# Patient Record
Sex: Female | Born: 1975 | Race: White | Hispanic: No | Smoking: Former smoker
Health system: Southern US, Community
[De-identification: ages and names within clinical notes are randomized; demographics above are authoritative.]

## PROBLEM LIST (undated history)

## (undated) DIAGNOSIS — K219 Gastro-esophageal reflux disease without esophagitis: Secondary | ICD-10-CM

## (undated) DIAGNOSIS — F329 Major depressive disorder, single episode, unspecified: Secondary | ICD-10-CM

## (undated) DIAGNOSIS — F32A Depression, unspecified: Secondary | ICD-10-CM

---

## 2000-08-19 ENCOUNTER — Other Ambulatory Visit: Admission: RE | Admit: 2000-08-19 | Discharge: 2000-08-19 | Payer: Self-pay | Admitting: Obstetrics and Gynecology

## 2001-08-24 ENCOUNTER — Other Ambulatory Visit: Admission: RE | Admit: 2001-08-24 | Discharge: 2001-08-24 | Payer: Self-pay | Admitting: Obstetrics and Gynecology

## 2002-08-28 ENCOUNTER — Other Ambulatory Visit: Admission: RE | Admit: 2002-08-28 | Discharge: 2002-08-28 | Payer: Self-pay | Admitting: Obstetrics and Gynecology

## 2002-10-12 ENCOUNTER — Ambulatory Visit (HOSPITAL_COMMUNITY): Admission: RE | Admit: 2002-10-12 | Discharge: 2002-10-12 | Payer: Self-pay | Admitting: Obstetrics and Gynecology

## 2002-10-12 ENCOUNTER — Encounter: Payer: Self-pay | Admitting: Obstetrics and Gynecology

## 2003-02-06 ENCOUNTER — Inpatient Hospital Stay (HOSPITAL_COMMUNITY): Admission: AD | Admit: 2003-02-06 | Discharge: 2003-02-06 | Payer: Self-pay | Admitting: Obstetrics and Gynecology

## 2003-03-06 ENCOUNTER — Inpatient Hospital Stay (HOSPITAL_COMMUNITY): Admission: AD | Admit: 2003-03-06 | Discharge: 2003-03-08 | Payer: Self-pay | Admitting: Obstetrics and Gynecology

## 2004-08-29 ENCOUNTER — Other Ambulatory Visit: Admission: RE | Admit: 2004-08-29 | Discharge: 2004-08-29 | Payer: Self-pay | Admitting: Obstetrics and Gynecology

## 2005-10-21 ENCOUNTER — Other Ambulatory Visit: Admission: RE | Admit: 2005-10-21 | Discharge: 2005-10-21 | Payer: Self-pay | Admitting: Obstetrics and Gynecology

## 2017-01-20 ENCOUNTER — Encounter (HOSPITAL_BASED_OUTPATIENT_CLINIC_OR_DEPARTMENT_OTHER): Payer: Self-pay

## 2017-01-20 ENCOUNTER — Emergency Department (HOSPITAL_BASED_OUTPATIENT_CLINIC_OR_DEPARTMENT_OTHER): Payer: BLUE CROSS/BLUE SHIELD

## 2017-01-20 ENCOUNTER — Observation Stay (HOSPITAL_BASED_OUTPATIENT_CLINIC_OR_DEPARTMENT_OTHER)
Admission: EM | Admit: 2017-01-20 | Discharge: 2017-01-21 | Disposition: A | Discharge status: Home / Self Care | Payer: BLUE CROSS/BLUE SHIELD | Attending: Surgery | Admitting: Surgery

## 2017-01-20 DIAGNOSIS — Z87891 Personal history of nicotine dependence: Secondary | ICD-10-CM | POA: Diagnosis not present

## 2017-01-20 DIAGNOSIS — K449 Diaphragmatic hernia without obstruction or gangrene: Secondary | ICD-10-CM | POA: Diagnosis not present

## 2017-01-20 DIAGNOSIS — F329 Major depressive disorder, single episode, unspecified: Secondary | ICD-10-CM | POA: Insufficient documentation

## 2017-01-20 DIAGNOSIS — K429 Umbilical hernia without obstruction or gangrene: Secondary | ICD-10-CM | POA: Diagnosis not present

## 2017-01-20 DIAGNOSIS — K219 Gastro-esophageal reflux disease without esophagitis: Secondary | ICD-10-CM | POA: Diagnosis not present

## 2017-01-20 DIAGNOSIS — F32A Depression, unspecified: Secondary | ICD-10-CM | POA: Diagnosis present

## 2017-01-20 DIAGNOSIS — K3580 Unspecified acute appendicitis: Secondary | ICD-10-CM

## 2017-01-20 DIAGNOSIS — R1031 Right lower quadrant pain: Secondary | ICD-10-CM | POA: Diagnosis present

## 2017-01-20 DIAGNOSIS — Z23 Encounter for immunization: Secondary | ICD-10-CM | POA: Diagnosis not present

## 2017-01-20 DIAGNOSIS — K353 Acute appendicitis with localized peritonitis, without perforation or gangrene: Secondary | ICD-10-CM

## 2017-01-20 DIAGNOSIS — K37 Unspecified appendicitis: Secondary | ICD-10-CM | POA: Diagnosis present

## 2017-01-20 HISTORY — DX: Gastro-esophageal reflux disease without esophagitis: K21.9

## 2017-01-20 HISTORY — DX: Major depressive disorder, single episode, unspecified: F32.9

## 2017-01-20 HISTORY — DX: Depression, unspecified: F32.A

## 2017-01-20 LAB — URINALYSIS, ROUTINE W REFLEX MICROSCOPIC
Glucose, UA: NEGATIVE mg/dL
Ketones, ur: >80 mg/dL — AB
Leukocytes, UA: NEGATIVE
Nitrite: NEGATIVE
Protein, ur: NEGATIVE mg/dL
Specific Gravity, Urine: 1.025 (ref 1.005–1.030)
pH: 6 (ref 5.0–8.0)

## 2017-01-20 LAB — CBC WITH DIFFERENTIAL/PLATELET
Basophils Absolute: 0 10*3/uL (ref 0.0–0.1)
Basophils Relative: 0 %
Eosinophils Absolute: 0.1 10*3/uL (ref 0.0–0.7)
Eosinophils Relative: 1 %
HEMATOCRIT: 35.7 % — AB (ref 36.0–46.0)
Hemoglobin: 12.6 g/dL (ref 12.0–15.0)
LYMPHS ABS: 2 10*3/uL (ref 0.7–4.0)
LYMPHS PCT: 14 %
MCH: 33.7 pg (ref 26.0–34.0)
MCHC: 35.3 g/dL (ref 30.0–36.0)
MCV: 95.5 fL (ref 78.0–100.0)
MONO ABS: 1 10*3/uL (ref 0.1–1.0)
MONOS PCT: 7 %
NEUTROS ABS: 11.3 10*3/uL — AB (ref 1.7–7.7)
Neutrophils Relative %: 78 %
Platelets: 246 10*3/uL (ref 150–400)
RBC: 3.74 MIL/uL — ABNORMAL LOW (ref 3.87–5.11)
RDW: 11.3 % — AB (ref 11.5–15.5)
WBC: 14.3 10*3/uL — ABNORMAL HIGH (ref 4.0–10.5)

## 2017-01-20 LAB — BASIC METABOLIC PANEL
ANION GAP: 9 (ref 5–15)
BUN: 8 mg/dL (ref 6–20)
CO2: 25 mmol/L (ref 22–32)
Calcium: 8.6 mg/dL — ABNORMAL LOW (ref 8.9–10.3)
Chloride: 101 mmol/L (ref 101–111)
Creatinine, Ser: 0.65 mg/dL (ref 0.44–1.00)
GFR calc Af Amer: >60 mL/min (ref >60)
GFR calc non Af Amer: >60 mL/min (ref >60)
GLUCOSE: 93 mg/dL (ref 65–99)
Potassium: 3.3 mmol/L — ABNORMAL LOW (ref 3.5–5.1)
Sodium: 135 mmol/L (ref 135–145)

## 2017-01-20 LAB — URINALYSIS, MICROSCOPIC (REFLEX): WBC, UA: NONE SEEN WBC/hpf (ref 0–5)

## 2017-01-20 LAB — PREGNANCY, URINE: Preg Test, Ur: NEGATIVE

## 2017-01-20 MED ORDER — SODIUM CHLORIDE 0.9 % IV BOLUS (SEPSIS)
1000.0000 mL | Freq: Once | INTRAVENOUS | Status: AC
  Administered 2017-01-20: 1000 mL via INTRAVENOUS

## 2017-01-20 MED ORDER — ONDANSETRON HCL 4 MG/2ML IJ SOLN
4.0000 mg | Freq: Once | INTRAMUSCULAR | Status: AC
  Administered 2017-01-20: 4 mg via INTRAVENOUS
  Filled 2017-01-20: qty 2

## 2017-01-20 MED ORDER — IOPAMIDOL (ISOVUE-300) INJECTION 61%
100.0000 mL | Freq: Once | INTRAVENOUS | Status: AC | PRN
  Administered 2017-01-20: 100 mL via INTRAVENOUS

## 2017-01-20 MED ORDER — PIPERACILLIN-TAZOBACTAM 3.375 G IVPB 30 MIN
3.3750 g | Freq: Once | INTRAVENOUS | Status: AC
  Administered 2017-01-20: 3.375 g via INTRAVENOUS
  Filled 2017-01-20 (×2): qty 50

## 2017-01-20 MED ORDER — LACTATED RINGERS IV BOLUS (SEPSIS)
1000.0000 mL | Freq: Once | INTRAVENOUS | Status: AC
  Administered 2017-01-20: 1000 mL via INTRAVENOUS

## 2017-01-20 NOTE — H&P (Signed)
[facility]  [address] [place], [place] [address] Phone: [phone] FAX: [phone]     Veronica Roberts  [birth year] [id number]  CARE TEAM:  PCP: No primary care provider on file.  Outpatient Care Team: No care team member to display  Inpatient Treatment Team: Treatment Team: Attending Provider: Davonna Belling, MD; Registered Nurse: Beckey Downing, RN; Consulting Physician: Nolon Nations, MD   This patient is a 41 y.o.female who presents today for surgical evaluation at the request of Dr Alvino Chapel.   Reason for evaluation: Appendcitis  Pleasant active woman with no prior surgeries or abdominal problems.  Noticed vague abdominal pain and decreased appetite during yesterday.  Became more intense.  Felt nauseated but did not vomit.  Normally moves her bowels every day but bowels became a little bit more loose.  No hematochezia or melena.  Pain became much more intense in the right lower quadrant.  Was concerned.  CAme to the [place] room.  History and physical suspicious for appendicitis.  Much more suspicious suspicious for that.  Surgical consultation requested.  Patient transferred to [facility] be able to evaluated by general surgery.  No personal nor family history of GI/colon cancer, inflammatory bowel disease, irritable bowel syndrome, allergy such as Celiac Sprue, dietary/dairy problems, colitis, ulcers nor gastritis.  No recent sick contacts/gastroenteritis.  No travel outside the country.  No changes in diet.  No dysphagia to solids or liquids.  No significant heartburn or reflux.  No hematochezia, hematemesis, coffee ground emesis.  No evidence of prior gastric/peptic ulceration.    Assessment  Veronica Roberts  41 y.o. female       Problem List:  Active Problems:   Acute appendicitis   Acute appendicitis  Plan:  -Admit -IVF -Nausea and pain control. -IV antibiotics.  We will do Zosyn given IV  metronidazole shortage -Diagnostic laparoscopy with appendectomy:  The anatomy & physiology of the digestive tract was discussed.  The pathophysiology of appendicitis and other appendiceal disorders were discussed.  Natural history risks without surgery was discussed.   I feel the risks of no intervention will lead to serious problems that outweigh the operative risks; therefore, I recommended diagnostic laparoscopy with removal of appendix to remove the pathology.  Laparoscopic & open techniques were discussed.   I noted a good likelihood this will help address the problem.   Risks such as bleeding, infection, abscess, leak, reoperation, injury to other organs, need for repair of tissues / organs, possible ostomy, hernia, heart attack, stroke, death, and other risks were discussed.  Goals of post-operative recovery were discussed as well.  We will work to minimize complications.  Questions were answered.  The patient & husband express understanding & wishes to proceed with surgery.  -VTE prophylaxis- SCDs, etc -mobilize as tolerated to help recovery    Adin Hector, M.D., F.A.C.S. Gastrointestinal and Minimally Invasive Surgery [facility] [address], [place], [place] [address] [phone] Main / Paging   01/20/2017      Past Medical History:  Diagnosis Date  . Depression   . GERD (gastroesophageal reflux disease)     History reviewed. No pertinent surgical history.  Social History   Social History  . Marital status: Married    Spouse name: N/A  . Number of children: N/A  . Years of education: N/A   Occupational History  . Not on file.   Social History Main Topics  .  Smoking status: Former Research scientist (life sciences)  . Smokeless tobacco: Never Used  . Alcohol use Yes     Comment: weekly  . Drug use: No  . Sexual activity: Not on file   Other Topics Concern  . Not on file   Social History Narrative  . No narrative on file    No family  history on file.  Current Facility-Administered Medications  Medication Dose Route Frequency Provider Last Rate Last Dose  . lactated ringers bolus 1,000 mL  1,000 mL Intravenous Once Michael Boston, MD      . piperacillin-tazobactam (ZOSYN) IVPB 3.375 g  3.375 g Intravenous Once Davonna Belling, MD       Current Outpatient Prescriptions  Medication Sig Dispense Refill  . Escitalopram Oxalate (LEXAPRO PO) Take by mouth.    . Omeprazole (PRILOSEC PO) Take by mouth.       No Known Allergies  ROS: Constitutional:  No fevers, chills, sweats.  Weight stable Eyes:  No vision changes, No discharge HENT:  No sore throats, nasal drainage Lymph: No neck swelling, No bruising easily Pulmonary:  No cough, productive sputum CV: No orthopnea, PND  Patient walks 90 minutes for about 5 miles without difficulty.  No exertional chest/neck/shoulder/arm pain. GI: No personal nor family history of GI/colon cancer, inflammatory bowel disease, irritable bowel syndrome, allergy such as Celiac Sprue, dietary/dairy problems, colitis, ulcers nor gastritis.  No recent sick contacts/gastroenteritis.  No travel outside the country.  No changes in diet. Renal: No UTIs, No hematuria Genital:  No drainage, bleeding, masses Musculoskeletal: No severe joint pain.  Good ROM major joints Skin:  No sores or lesions.  No rashes Heme/Lymph:  No easy bleeding.  No swollen lymph nodes Neuro: No focal weakness/numbness.  No seizures Psych: No suicidal ideation.  No hallucinations  BP 108/75 (BP Location: Right Arm)   Pulse 71   Temp 99.1 F (37.3 C) (Oral)   Resp 16   Ht 5' 9"  (1.753 m)   Wt 90.3 kg (199 lb)   SpO2 100%   BMI 29.39 kg/m   Physical Exam: General: Pt awake/alert/oriented x4 in mild major acute distress Eyes: PERRL, normal EOM. Sclera nonicteric Neuro: CN II-XII intact w/o focal sensory/motor deficits. Lymph: No head/neck/groin lymphadenopathy Psych:  No delerium/psychosis/paranoia HENT:  Normocephalic, Mucus membranes moist.  No thrush Neck: Supple, No tracheal deviation Chest: No pain.  Good respiratory excursion. CV:  Pulses intact.  Regular rhythm  Abdomen: Soft, Nondistended.  TTP RLQ/suprapubic with guarding.  No diastasis recti.  No umbilical hernia.    Gen:  No inguinal hernias.  No inguinal lymphadenopathy.   Ext:  SCDs BLE.  No significant edema.  No cyanosis Skin: No petechiae / purpurea.  No major sores Musculoskeletal: No severe joint pain.  Good ROM major joints   Results:   Labs: Results for orders placed or performed during the hospital encounter of 01/20/17 (from the past 48 hour(s))  Pregnancy, urine     Status: None   Collection Time: 01/20/17  7:20 PM  Result Value Ref Range   Preg Test, Ur NEGATIVE NEGATIVE    Comment:        THE SENSITIVITY OF THIS METHODOLOGY IS >20 mIU/mL.   Urinalysis, Routine w reflex microscopic     Status: Abnormal   Collection Time: 01/20/17  7:20 PM  Result Value Ref Range   Color, Urine ORANGE (A) YELLOW    Comment: BIOCHEMICALS MAY BE AFFECTED BY COLOR   APPearance CLEAR CLEAR   Specific Gravity,  Urine 1.025 1.005 - 1.030   pH 6.0 5.0 - 8.0   Glucose, UA NEGATIVE NEGATIVE mg/dL   Hgb urine dipstick TRACE (A) NEGATIVE   Bilirubin Urine MODERATE (A) NEGATIVE   Ketones, ur >80 (A) NEGATIVE mg/dL   Protein, ur NEGATIVE NEGATIVE mg/dL   Nitrite NEGATIVE NEGATIVE   Leukocytes, UA NEGATIVE NEGATIVE  Urinalysis, Microscopic (reflex)     Status: Abnormal   Collection Time: 01/20/17  7:20 PM  Result Value Ref Range   RBC / HPF 0-5 0 - 5 RBC/hpf   WBC, UA NONE SEEN 0 - 5 WBC/hpf   Bacteria, UA RARE (A) NONE SEEN   Squamous Epithelial / LPF 0-5 (A) NONE SEEN  CBC with Differential     Status: Abnormal   Collection Time: 01/20/17  8:30 PM  Result Value Ref Range   WBC 14.3 (H) 4.0 - 10.5 K/uL   RBC 3.74 (L) 3.87 - 5.11 MIL/uL   Hemoglobin 12.6 12.0 - 15.0 g/dL   HCT 35.7 (L) 36.0 - 46.0 %   MCV 95.5 78.0 - 100.0  fL   MCH 33.7 26.0 - 34.0 pg   MCHC 35.3 30.0 - 36.0 g/dL   RDW 11.3 (L) 11.5 - 15.5 %   Platelets 246 150 - 400 K/uL   Neutrophils Relative % 78 %   Neutro Abs 11.3 (H) 1.7 - 7.7 K/uL   Lymphocytes Relative 14 %   Lymphs Abs 2.0 0.7 - 4.0 K/uL   Monocytes Relative 7 %   Monocytes Absolute 1.0 0.1 - 1.0 K/uL   Eosinophils Relative 1 %   Eosinophils Absolute 0.1 0.0 - 0.7 K/uL   Basophils Relative 0 %   Basophils Absolute 0.0 0.0 - 0.1 K/uL  Basic metabolic panel     Status: Abnormal   Collection Time: 01/20/17  8:30 PM  Result Value Ref Range   Sodium 135 135 - 145 mmol/L   Potassium 3.3 (L) 3.5 - 5.1 mmol/L   Chloride 101 101 - 111 mmol/L   CO2 25 22 - 32 mmol/L   Glucose, Bld 93 65 - 99 mg/dL   BUN 8 6 - 20 mg/dL   Creatinine, Ser 0.65 0.44 - 1.00 mg/dL   Calcium 8.6 (L) 8.9 - 10.3 mg/dL   GFR calc non Af Amer >60 >60 mL/min   GFR calc Af Amer >60 >60 mL/min    Comment: (NOTE) The eGFR has been calculated using the CKD EPI equation. This calculation has not been validated in all clinical situations. eGFR's persistently <60 mL/min signify possible Chronic Kidney Disease.    Anion gap 9 5 - 15    Imaging / Studies: Ct Abdomen Pelvis W Contrast  Result Date: 01/20/2017 CLINICAL DATA:  Abdominal pain for 3 days, nausea. EXAM: CT ABDOMEN AND PELVIS WITH CONTRAST TECHNIQUE: Multidetector CT imaging of the abdomen and pelvis was performed using the standard protocol following bolus administration of intravenous contrast. CONTRAST:  163m ISOVUE-300 IOPAMIDOL (ISOVUE-300) INJECTION 61% COMPARISON:  None. FINDINGS: LOWER CHEST: Lung bases are clear. Included heart size is normal. No pericardial effusion. HEPATOBILIARY: A few scattered subcentimeter hypodensities in the liver most compatible with cysts, otherwise unremarkable. Gallbladder is normal. PANCREAS: Normal. SPLEEN: Normal. ADRENALS/URINARY TRACT: Kidneys are orthotopic, demonstrating symmetric enhancement. No  nephrolithiasis, hydronephrosis or solid renal masses. The unopacified ureters are normal in course and caliber. Delayed imaging through the kidneys demonstrates symmetric prompt contrast excretion within the proximal urinary collecting system. Urinary bladder is partially distended and unremarkable. Normal  adrenal glands. STOMACH/BOWEL: Small hiatal hernia. The stomach, small and large bowel are normal in course and caliber without inflammatory changes. The appendix is 16 mm in transaxial dimension, hyperemic with periappendiceal inflammation. Central appendiceal debris without appendicolith. VASCULAR/LYMPHATIC: Aortoiliac vessels are normal in course and caliber, trace calcific atherosclerosis. Borderline lymph nodes RIGHT lower quadrant are likely reactive. REPRODUCTIVE: IUD in central uterus. OTHER: Trace free fluid in RIGHT lower quadrant. Small amount of free fluid in the pelvis which may be physiologic or reactive to appendiceal process. MUSCULOSKELETAL: Nonacute. Small fat containing umbilical hernia. Scattered Schmorl's nodes. IMPRESSION: Acute non perforated appendicitis. Acute findings discussed with and reconfirmed by Dr.NATHAN PICKERING on 01/20/2017 at 10:06 pm. Electronically Signed   By: Elon Alas M.D.   On: 01/20/2017 22:07    Medications / Allergies: per chart  Antibiotics: Anti-infectives    Start     Dose/Rate Route Frequency Ordered Stop   01/20/17 2230  piperacillin-tazobactam (ZOSYN) IVPB 3.375 g     3.375 g 100 mL/hr over 30 Minutes Intravenous  Once 01/20/17 2217          Note: Portions of this report may have been transcribed using voice recognition software. Every effort was made to ensure accuracy; however, inadvertent computerized transcription errors may be present.   Any transcriptional errors that result from this process are unintentional.    Adin Hector, M.D., F.A.C.S. Gastrointestinal and Minimally Invasive Surgery [facility] [address] [place], [place] [address] [phone] Main / Paging   01/20/2017

## 2017-01-20 NOTE — ED Triage Notes (Signed)
C/o abd pain x 3 days-nausea-no v/d-NAD-steady gait

## 2017-01-20 NOTE — ED Provider Notes (Signed)
[facility] DEPT [facility] Provider Note   CSN: [id number] Arrival date & time: 01/20/17  1837  By signing my name below, I, Jaquelyn Bitter., attest that this documentation has been prepared under the direction and in the presence of Davonna Belling, MD. Electronically signed: Jaquelyn Bitter., ED Scribe. 01/20/17. 10:55 PM.   History   Chief Complaint Chief Complaint  Patient presents with  . Abdominal Pain    HPI  Veronica Roberts is a 41 y.o. female who presents to the Emergency Department complaining of constantly worsening, mild to moderate abdominal pain with sudden onset x3 days. Pt states that she awoke Monday night with sharp epigastric abdominal pain. She states that the pain has been constant since Monday but today, the pain is localized in RLQ. She reports the pain being exacerbated with sudden movements. Pt reports nausea, chills, appetite change. She denies taking anything for the pain. Pt denies fever, dysuria, vaginal bleeding/discharge, frequency, urgency. Of note, pt has had IUD for x13 years.    The history is provided by the patient. No language interpreter was used.    Past Medical History:  Diagnosis Date  . Depression   . GERD (gastroesophageal reflux disease)     Patient Active Problem List   Diagnosis Date Noted  . Acute appendicitis 01/20/2017  . GERD (gastroesophageal reflux disease)   . Depression     History reviewed. No pertinent surgical history.  OB History    No data available       Home Medications    Prior to Admission medications   Medication Sig Start Date End Date Taking? Authorizing Provider  Escitalopram Oxalate (LEXAPRO PO) Take by mouth.   Yes Historical Provider, MD  Omeprazole (PRILOSEC PO) Take by mouth.   Yes Historical Provider, MD    Family History No family history on file.  Social History Social History  Substance Use Topics  . Smoking status: Former Research scientist (life sciences)  . Smokeless tobacco: Never Used    . Alcohol use Yes     Comment: weekly     Allergies   Patient has no known allergies.   Review of Systems Review of Systems  Constitutional: Positive for appetite change and chills. Negative for fever.  HENT: Negative for congestion.   Gastrointestinal: Positive for abdominal pain (RLQ) and nausea. Negative for vomiting.  Genitourinary: Negative for dysuria, frequency, urgency, vaginal bleeding and vaginal discharge.  All other systems reviewed and are negative.    Physical Exam Updated Vital Signs BP 108/75 (BP Location: Right Arm)   Pulse 71   Temp 99.1 F (37.3 C) (Oral)   Resp 16   Ht 5' 9"  (1.753 m)   Wt 199 lb (90.3 kg)   SpO2 100%   BMI 29.39 kg/m   Physical Exam  Constitutional: She appears well-developed and well-nourished. No distress.  HENT:  Head: Normocephalic and atraumatic.  Eyes: Conjunctivae are normal.  Neck: Neck supple.  Cardiovascular: Normal rate and regular rhythm.   No murmur heard. Pulmonary/Chest: Effort normal and breath sounds normal. No respiratory distress.  Abdominal: Soft. There is tenderness in the right lower quadrant. There is guarding.  Moderate RLQ tenderness, no masses. Pt exhibits involuntary guarding.   Musculoskeletal: She exhibits no edema.  Neurological: She is alert.  Skin: Skin is warm and dry.  Psychiatric: She has a normal mood and affect.  Nursing note and vitals reviewed.    ED Treatments / Results   DIAGNOSTIC STUDIES: Oxygen Saturation is 100% on  RA, normal by my interpretation.   COORDINATION OF CARE: 10:55 PM-Discussed next steps with pt. Pt verbalized understanding and is agreeable with the plan.    Labs (all labs ordered are listed, but only abnormal results are displayed) Labs Reviewed  URINALYSIS, ROUTINE W REFLEX MICROSCOPIC - Abnormal; Notable for the following:       Result Value   Color, Urine ORANGE (*)    Hgb urine dipstick TRACE (*)    Bilirubin Urine MODERATE (*)    Ketones, ur >80  (*)    All other components within normal limits  URINALYSIS, MICROSCOPIC (REFLEX) - Abnormal; Notable for the following:    Bacteria, UA RARE (*)    Squamous Epithelial / LPF 0-5 (*)    All other components within normal limits  CBC WITH DIFFERENTIAL/PLATELET - Abnormal; Notable for the following:    WBC 14.3 (*)    RBC 3.74 (*)    HCT 35.7 (*)    RDW 11.3 (*)    Neutro Abs 11.3 (*)    All other components within normal limits  BASIC METABOLIC PANEL - Abnormal; Notable for the following:    Potassium 3.3 (*)    Calcium 8.6 (*)    All other components within normal limits  PREGNANCY, URINE    EKG  EKG Interpretation None       Radiology Ct Abdomen Pelvis W Contrast  Result Date: 01/20/2017 CLINICAL DATA:  Abdominal pain for 3 days, nausea. EXAM: CT ABDOMEN AND PELVIS WITH CONTRAST TECHNIQUE: Multidetector CT imaging of the abdomen and pelvis was performed using the standard protocol following bolus administration of intravenous contrast. CONTRAST:  152m ISOVUE-300 IOPAMIDOL (ISOVUE-300) INJECTION 61% COMPARISON:  None. FINDINGS: LOWER CHEST: Lung bases are clear. Included heart size is normal. No pericardial effusion. HEPATOBILIARY: A few scattered subcentimeter hypodensities in the liver most compatible with cysts, otherwise unremarkable. Gallbladder is normal. PANCREAS: Normal. SPLEEN: Normal. ADRENALS/URINARY TRACT: Kidneys are orthotopic, demonstrating symmetric enhancement. No nephrolithiasis, hydronephrosis or solid renal masses. The unopacified ureters are normal in course and caliber. Delayed imaging through the kidneys demonstrates symmetric prompt contrast excretion within the proximal urinary collecting system. Urinary bladder is partially distended and unremarkable. Normal adrenal glands. STOMACH/BOWEL: Small hiatal hernia. The stomach, small and large bowel are normal in course and caliber without inflammatory changes. The appendix is 16 mm in transaxial dimension,  hyperemic with periappendiceal inflammation. Central appendiceal debris without appendicolith. VASCULAR/LYMPHATIC: Aortoiliac vessels are normal in course and caliber, trace calcific atherosclerosis. Borderline lymph nodes RIGHT lower quadrant are likely reactive. REPRODUCTIVE: IUD in central uterus. OTHER: Trace free fluid in RIGHT lower quadrant. Small amount of free fluid in the pelvis which may be physiologic or reactive to appendiceal process. MUSCULOSKELETAL: Nonacute. Small fat containing umbilical hernia. Scattered Schmorl's nodes. IMPRESSION: Acute non perforated appendicitis. Acute findings discussed with and reconfirmed by Dr.Walter Min on 01/20/2017 at 10:06 pm. Electronically Signed   By: CElon AlasM.D.   On: 01/20/2017 22:07    Procedures Procedures (including critical care time)  Medications Ordered in ED Medications  piperacillin-tazobactam (ZOSYN) IVPB 3.375 g (3.375 g Intravenous New Bag/Given 01/20/17 2227)  lactated ringers bolus 1,000 mL (not administered)  sodium chloride 0.9 % bolus 1,000 mL (0 mLs Intravenous Stopped 01/20/17 2131)  ondansetron (ZOFRAN) injection 4 mg (4 mg Intravenous Given 01/20/17 2040)  iopamidol (ISOVUE-300) 61 % injection 100 mL (100 mLs Intravenous Contrast Given 01/20/17 2145)     Initial Impression / Assessment and Plan / ED Course  I have reviewed the triage vital signs and the nursing notes.  Pertinent labs & imaging results that were available during my care of the patient were reviewed by me and considered in my medical decision making (see chart for details).     Patient with Abdominal pain. Found to have appendicitis. White count is elevated. Discussed with Dr. gross, who will not accept the patient to him but he will see her there. Patient prefers to go by private vehicle. Will give fluid bolus and antibiotics here. Will keep patient nothing by mouth. I also discussed with Dr. Ellender Hose at [place].  Final Clinical  Impressions(s) / ED Diagnoses   Final diagnoses:  Acute appendicitis with localized peritonitis    New Prescriptions New Prescriptions   No medications on file   I personally performed the services described in this documentation, which was scribed in my presence. The recorded information has been reviewed and is accurate.        Davonna Belling, MD 01/20/17 2255

## 2017-01-21 ENCOUNTER — Inpatient Hospital Stay (HOSPITAL_COMMUNITY): Payer: BLUE CROSS/BLUE SHIELD | Admitting: Registered Nurse

## 2017-01-21 ENCOUNTER — Encounter (HOSPITAL_COMMUNITY): Admission: EM | Disposition: A | Discharge status: Home / Self Care | Payer: Self-pay | Source: Home / Self Care

## 2017-01-21 ENCOUNTER — Encounter (HOSPITAL_COMMUNITY): Payer: Self-pay | Admitting: *Deleted

## 2017-01-21 DIAGNOSIS — K37 Unspecified appendicitis: Secondary | ICD-10-CM | POA: Diagnosis present

## 2017-01-21 HISTORY — PX: LAPAROSCOPIC APPENDECTOMY: SHX408

## 2017-01-21 LAB — SURGICAL PCR SCREEN
MRSA, PCR: INVALID — AB
Staphylococcus aureus: INVALID — AB

## 2017-01-21 SURGERY — APPENDECTOMY, LAPAROSCOPIC
Anesthesia: General | Site: Abdomen

## 2017-01-21 MED ORDER — OXYCODONE HCL 5 MG PO TABS: 5.0000 mg | ORAL_TABLET | ORAL | 0 refills | Status: AC | PRN

## 2017-01-21 MED ORDER — CELECOXIB 400 MG PO CAPS
400.0000 mg | ORAL_CAPSULE | ORAL | Status: AC
  Administered 2017-01-21: 400 mg via ORAL
  Filled 2017-01-21: qty 2
  Filled 2017-01-21: qty 1

## 2017-01-21 MED ORDER — PROPOFOL 10 MG/ML IV BOLUS
INTRAVENOUS | Status: AC
  Filled 2017-01-21: qty 20

## 2017-01-21 MED ORDER — LACTATED RINGERS IV SOLN
INTRAVENOUS | Status: DC | PRN
  Administered 2017-01-21: 10:00:00 via INTRAVENOUS

## 2017-01-21 MED ORDER — ONDANSETRON 4 MG PO TBDP: 4.0000 mg | ORAL_TABLET | Freq: Four times a day (QID) | ORAL | Status: DC | PRN

## 2017-01-21 MED ORDER — SODIUM CHLORIDE 0.9 % IJ SOLN
INTRAMUSCULAR | Status: AC
  Filled 2017-01-21: qty 10

## 2017-01-21 MED ORDER — 0.9 % SODIUM CHLORIDE (POUR BTL) OPTIME
TOPICAL | Status: DC | PRN
  Administered 2017-01-21: 1000 mL

## 2017-01-21 MED ORDER — PIPERACILLIN-TAZOBACTAM 3.375 G IVPB
3.3750 g | Freq: Three times a day (TID) | INTRAVENOUS | Status: DC
  Administered 2017-01-21 (×2): 3.375 g via INTRAVENOUS
  Filled 2017-01-21 (×3): qty 50

## 2017-01-21 MED ORDER — OXYCODONE HCL 5 MG PO TABS: 5.0000 mg | ORAL_TABLET | ORAL | Status: DC | PRN

## 2017-01-21 MED ORDER — LACTATED RINGERS IV SOLN: INTRAVENOUS | Status: DC

## 2017-01-21 MED ORDER — HYDROMORPHONE HCL 2 MG/ML IJ SOLN: 0.5000 mg | INTRAMUSCULAR | Status: DC | PRN

## 2017-01-21 MED ORDER — DEXTROSE 5 % IV SOLN
2.0000 g | INTRAVENOUS | Status: AC
  Administered 2017-01-21: 2 g via INTRAVENOUS
  Filled 2017-01-21: qty 2

## 2017-01-21 MED ORDER — METHOCARBAMOL 1000 MG/10ML IJ SOLN
1000.0000 mg | Freq: Four times a day (QID) | INTRAVENOUS | Status: DC | PRN
  Filled 2017-01-21: qty 10

## 2017-01-21 MED ORDER — METOCLOPRAMIDE HCL 5 MG/ML IJ SOLN: 5.0000 mg | Freq: Four times a day (QID) | INTRAMUSCULAR | Status: DC | PRN

## 2017-01-21 MED ORDER — ZOLPIDEM TARTRATE 5 MG PO TABS: 5.0000 mg | ORAL_TABLET | Freq: Every evening | ORAL | Status: DC | PRN

## 2017-01-21 MED ORDER — MENTHOL 3 MG MT LOZG: 1.0000 | LOZENGE | OROMUCOSAL | Status: DC | PRN

## 2017-01-21 MED ORDER — ENOXAPARIN SODIUM 40 MG/0.4ML ~~LOC~~ SOLN: 40.0000 mg | SUBCUTANEOUS | Status: DC

## 2017-01-21 MED ORDER — MIDAZOLAM HCL 5 MG/5ML IJ SOLN
INTRAMUSCULAR | Status: DC | PRN
  Administered 2017-01-21: 2 mg via INTRAVENOUS

## 2017-01-21 MED ORDER — LACTATED RINGERS IR SOLN
Status: DC | PRN
  Administered 2017-01-21: 1000 mL

## 2017-01-21 MED ORDER — ALUM & MAG HYDROXIDE-SIMETH 200-200-20 MG/5ML PO SUSP: 30.0000 mL | Freq: Four times a day (QID) | ORAL | Status: DC | PRN

## 2017-01-21 MED ORDER — ROCURONIUM BROMIDE 10 MG/ML (PF) SYRINGE
PREFILLED_SYRINGE | INTRAVENOUS | Status: DC | PRN
  Administered 2017-01-21: 40 mg via INTRAVENOUS

## 2017-01-21 MED ORDER — LIDOCAINE HCL (CARDIAC) 20 MG/ML IV SOLN
INTRAVENOUS | Status: DC | PRN
  Administered 2017-01-21: 100 mg via INTRAVENOUS

## 2017-01-21 MED ORDER — CHLORHEXIDINE GLUCONATE CLOTH 2 % EX PADS: 6.0000 | MEDICATED_PAD | Freq: Once | CUTANEOUS | Status: DC

## 2017-01-21 MED ORDER — LACTATED RINGERS IV BOLUS (SEPSIS)
1000.0000 mL | Freq: Once | INTRAVENOUS | Status: AC
  Administered 2017-01-21: 1000 mL via INTRAVENOUS

## 2017-01-21 MED ORDER — ROCURONIUM BROMIDE 50 MG/5ML IV SOSY
PREFILLED_SYRINGE | INTRAVENOUS | Status: AC
  Filled 2017-01-21: qty 5

## 2017-01-21 MED ORDER — LIP MEDEX EX OINT
1.0000 "application " | TOPICAL_OINTMENT | Freq: Two times a day (BID) | CUTANEOUS | Status: DC
  Administered 2017-01-21: 1 via TOPICAL
  Filled 2017-01-21: qty 7

## 2017-01-21 MED ORDER — SUGAMMADEX SODIUM 200 MG/2ML IV SOLN
INTRAVENOUS | Status: DC | PRN
  Administered 2017-01-21: 180 mg via INTRAVENOUS

## 2017-01-21 MED ORDER — ONDANSETRON HCL 4 MG/2ML IJ SOLN
4.0000 mg | Freq: Four times a day (QID) | INTRAMUSCULAR | Status: DC | PRN
Start: 2017-01-21 — End: 2017-01-21
  Administered 2017-01-21: 4 mg via INTRAVENOUS
  Filled 2017-01-21: qty 2

## 2017-01-21 MED ORDER — IBUPROFEN 200 MG PO TABS
400.0000 mg | ORAL_TABLET | Freq: Four times a day (QID) | ORAL | Status: DC | PRN
  Administered 2017-01-21: 800 mg via ORAL
  Filled 2017-01-21: qty 4

## 2017-01-21 MED ORDER — SCOPOLAMINE 1 MG/3DAYS TD PT72
MEDICATED_PATCH | TRANSDERMAL | Status: DC | PRN
  Administered 2017-01-21: 1 via TRANSDERMAL

## 2017-01-21 MED ORDER — SIMETHICONE 80 MG PO CHEW: 40.0000 mg | CHEWABLE_TABLET | Freq: Four times a day (QID) | ORAL | Status: DC | PRN

## 2017-01-21 MED ORDER — HYDRALAZINE HCL 20 MG/ML IJ SOLN: 5.0000 mg | Freq: Four times a day (QID) | INTRAMUSCULAR | Status: DC | PRN

## 2017-01-21 MED ORDER — FENTANYL CITRATE (PF) 100 MCG/2ML IJ SOLN: 25.0000 ug | INTRAMUSCULAR | Status: DC | PRN

## 2017-01-21 MED ORDER — PROMETHAZINE HCL 25 MG/ML IJ SOLN: 6.2500 mg | INTRAMUSCULAR | Status: DC | PRN

## 2017-01-21 MED ORDER — CHLORHEXIDINE GLUCONATE CLOTH 2 % EX PADS
6.0000 | MEDICATED_PAD | Freq: Once | CUTANEOUS | Status: AC
  Administered 2017-01-21: 6 via TOPICAL

## 2017-01-21 MED ORDER — PROPOFOL 10 MG/ML IV BOLUS
INTRAVENOUS | Status: DC | PRN
  Administered 2017-01-21: 180 mg via INTRAVENOUS

## 2017-01-21 MED ORDER — SUGAMMADEX SODIUM 200 MG/2ML IV SOLN
INTRAVENOUS | Status: AC
  Filled 2017-01-21: qty 2

## 2017-01-21 MED ORDER — GABAPENTIN 300 MG PO CAPS
300.0000 mg | ORAL_CAPSULE | ORAL | Status: AC
  Administered 2017-01-21: 300 mg via ORAL
  Filled 2017-01-21: qty 1

## 2017-01-21 MED ORDER — DIPHENHYDRAMINE HCL 12.5 MG/5ML PO ELIX: 12.5000 mg | ORAL_SOLUTION | Freq: Four times a day (QID) | ORAL | Status: DC | PRN

## 2017-01-21 MED ORDER — ONDANSETRON 4 MG PO TBDP: 4.0000 mg | ORAL_TABLET | Freq: Four times a day (QID) | ORAL | 0 refills | Status: AC | PRN

## 2017-01-21 MED ORDER — MIDAZOLAM HCL 2 MG/2ML IJ SOLN
INTRAMUSCULAR | Status: AC
  Filled 2017-01-21: qty 2

## 2017-01-21 MED ORDER — PHENOL 1.4 % MT LIQD
2.0000 | OROMUCOSAL | Status: DC | PRN
  Filled 2017-01-21: qty 177

## 2017-01-21 MED ORDER — ONDANSETRON HCL 4 MG/2ML IJ SOLN
INTRAMUSCULAR | Status: DC | PRN
  Administered 2017-01-21: 4 mg via INTRAVENOUS

## 2017-01-21 MED ORDER — CEFOTETAN DISODIUM-DEXTROSE 2-2.08 GM-% IV SOLR
INTRAVENOUS | Status: AC
  Filled 2017-01-21: qty 50

## 2017-01-21 MED ORDER — ALBUTEROL SULFATE (2.5 MG/3ML) 0.083% IN NEBU: 2.5000 mg | INHALATION_SOLUTION | Freq: Four times a day (QID) | RESPIRATORY_TRACT | Status: DC | PRN

## 2017-01-21 MED ORDER — METOPROLOL TARTRATE 5 MG/5ML IV SOLN: 5.0000 mg | Freq: Four times a day (QID) | INTRAVENOUS | Status: DC | PRN

## 2017-01-21 MED ORDER — FENTANYL CITRATE (PF) 250 MCG/5ML IJ SOLN
INTRAMUSCULAR | Status: AC
  Filled 2017-01-21: qty 5

## 2017-01-21 MED ORDER — DEXAMETHASONE SODIUM PHOSPHATE 10 MG/ML IJ SOLN
INTRAMUSCULAR | Status: DC | PRN
  Administered 2017-01-21: 10 mg via INTRAVENOUS

## 2017-01-21 MED ORDER — INFLUENZA VAC SPLIT QUAD 0.5 ML IM SUSY
0.5000 mL | PREFILLED_SYRINGE | INTRAMUSCULAR | Status: AC
Start: 2017-01-22 — End: 2017-01-21
  Administered 2017-01-21: 0.5 mL via INTRAMUSCULAR
  Filled 2017-01-21: qty 0.5

## 2017-01-21 MED ORDER — FENTANYL CITRATE (PF) 100 MCG/2ML IJ SOLN
INTRAMUSCULAR | Status: DC | PRN
  Administered 2017-01-21 (×2): 50 ug via INTRAVENOUS

## 2017-01-21 MED ORDER — MAGIC MOUTHWASH
15.0000 mL | Freq: Four times a day (QID) | ORAL | Status: DC | PRN
  Filled 2017-01-21: qty 15

## 2017-01-21 MED ORDER — DIPHENHYDRAMINE HCL 50 MG/ML IJ SOLN: 12.5000 mg | Freq: Four times a day (QID) | INTRAMUSCULAR | Status: DC | PRN

## 2017-01-21 MED ORDER — ONDANSETRON HCL 4 MG/2ML IJ SOLN
INTRAMUSCULAR | Status: AC
  Filled 2017-01-21: qty 2

## 2017-01-21 MED ORDER — BUPIVACAINE HCL (PF) 0.25 % IJ SOLN
INTRAMUSCULAR | Status: AC
  Filled 2017-01-21: qty 30

## 2017-01-21 MED ORDER — ACETAMINOPHEN 500 MG PO TABS
1000.0000 mg | ORAL_TABLET | ORAL | Status: AC
  Administered 2017-01-21: 1000 mg via ORAL
  Filled 2017-01-21: qty 2

## 2017-01-21 MED ORDER — DEXAMETHASONE SODIUM PHOSPHATE 10 MG/ML IJ SOLN
INTRAMUSCULAR | Status: AC
  Filled 2017-01-21: qty 1

## 2017-01-21 MED ORDER — PIPERACILLIN-TAZOBACTAM 3.375 G IVPB
3.3750 g | Freq: Three times a day (TID) | INTRAVENOUS | Status: DC
  Administered 2017-01-21: 3.375 g via INTRAVENOUS
  Filled 2017-01-21 (×2): qty 50

## 2017-01-21 MED ORDER — LACTATED RINGERS IV BOLUS (SEPSIS): 1000.0000 mL | Freq: Three times a day (TID) | INTRAVENOUS | Status: DC | PRN

## 2017-01-21 MED ORDER — BUPIVACAINE HCL (PF) 0.25 % IJ SOLN
INTRAMUSCULAR | Status: DC | PRN
  Administered 2017-01-21: 30 mL

## 2017-01-21 MED ORDER — ACETAMINOPHEN 325 MG PO TABS: 650.0000 mg | ORAL_TABLET | Freq: Four times a day (QID) | ORAL | Status: DC | PRN

## 2017-01-21 MED ORDER — ACETAMINOPHEN 650 MG RE SUPP: 650.0000 mg | Freq: Four times a day (QID) | RECTAL | Status: DC | PRN

## 2017-01-21 MED ORDER — LIDOCAINE 2% (20 MG/ML) 5 ML SYRINGE
INTRAMUSCULAR | Status: AC
Start: 2017-01-21 — End: 2017-01-21
  Filled 2017-01-21: qty 5

## 2017-01-21 MED ORDER — SCOPOLAMINE 1 MG/3DAYS TD PT72
MEDICATED_PATCH | TRANSDERMAL | Status: AC
  Filled 2017-01-21: qty 1

## 2017-01-21 SURGICAL SUPPLY — 42 items
APPLIER CLIP 5 13 M/L LIGAMAX5 (MISCELLANEOUS)
APPLIER CLIP ROT 10 11.4 M/L (STAPLE)
CABLE HIGH FREQUENCY MONO STRZ (ELECTRODE) ×3 IMPLANT
CHLORAPREP W/TINT 26ML (MISCELLANEOUS) ×3 IMPLANT
CLIP APPLIE 5 13 M/L LIGAMAX5 (MISCELLANEOUS) IMPLANT
CLIP APPLIE ROT 10 11.4 M/L (STAPLE) IMPLANT
COVER SURGICAL LIGHT HANDLE (MISCELLANEOUS) IMPLANT
CUTTER FLEX LINEAR 45M (STAPLE) ×3 IMPLANT
DECANTER SPIKE VIAL GLASS SM (MISCELLANEOUS) ×3 IMPLANT
DERMABOND ADVANCED (GAUZE/BANDAGES/DRESSINGS) ×2
DERMABOND ADVANCED .7 DNX12 (GAUZE/BANDAGES/DRESSINGS) ×1 IMPLANT
DRAPE LAPAROSCOPIC ABDOMINAL (DRAPES) ×3 IMPLANT
ELECT REM PT RETURN 9FT ADLT (ELECTROSURGICAL) ×3
ELECTRODE REM PT RTRN 9FT ADLT (ELECTROSURGICAL) ×1 IMPLANT
ENDOLOOP SUT PDS II  0 18 (SUTURE)
ENDOLOOP SUT PDS II 0 18 (SUTURE) IMPLANT
GLOVE BIO SURGEON STRL SZ 6 (GLOVE) ×3 IMPLANT
GLOVE INDICATOR 6.5 STRL GRN (GLOVE) ×3 IMPLANT
GOWN STRL REUS W/ TWL LRG LVL3 (GOWN DISPOSABLE) ×1 IMPLANT
GOWN STRL REUS W/TWL LRG LVL3 (GOWN DISPOSABLE) ×2
GOWN STRL REUS W/TWL XL LVL3 (GOWN DISPOSABLE) ×3 IMPLANT
GRASPER SUT TROCAR 14GX15 (MISCELLANEOUS) IMPLANT
IRRIG SUCT STRYKERFLOW 2 WTIP (MISCELLANEOUS) ×3
IRRIGATION SUCT STRKRFLW 2 WTP (MISCELLANEOUS) ×1 IMPLANT
KIT BASIN OR (CUSTOM PROCEDURE TRAY) ×3 IMPLANT
LIQUID BAND (GAUZE/BANDAGES/DRESSINGS) ×3 IMPLANT
NEEDLE INSUFFLATION 14GA 120MM (NEEDLE) ×3 IMPLANT
POUCH SPECIMEN RETRIEVAL 10MM (ENDOMECHANICALS) ×3 IMPLANT
RELOAD 45 VASCULAR/THIN (ENDOMECHANICALS) IMPLANT
RELOAD STAPLE 45 2.5 WHT GRN (ENDOMECHANICALS) IMPLANT
RELOAD STAPLE 45 3.5 BLU ETS (ENDOMECHANICALS) ×1 IMPLANT
RELOAD STAPLE TA45 3.5 REG BLU (ENDOMECHANICALS) ×3 IMPLANT
SCISSORS LAP 5X35 DISP (ENDOMECHANICALS) ×3 IMPLANT
SHEARS HARMONIC ACE PLUS 36CM (ENDOMECHANICALS) ×3 IMPLANT
SLEEVE XCEL OPT CAN 5 100 (ENDOMECHANICALS) ×6 IMPLANT
SUT MNCRL AB 4-0 PS2 18 (SUTURE) ×3 IMPLANT
TOWEL OR 17X26 10 PK STRL BLUE (TOWEL DISPOSABLE) ×3 IMPLANT
TOWEL OR NON WOVEN STRL DISP B (DISPOSABLE) ×3 IMPLANT
TRAY LAPAROSCOPIC (CUSTOM PROCEDURE TRAY) ×3 IMPLANT
TROCAR BLADELESS OPT 5 100 (ENDOMECHANICALS) ×3 IMPLANT
TROCAR XCEL 12X100 BLDLESS (ENDOMECHANICALS) ×3 IMPLANT
TUBING INSUF HEATED (TUBING) ×3 IMPLANT

## 2017-01-21 NOTE — Transfer of Care (Signed)
Immediate Anesthesia Transfer of Care Note  Patient: Veronica Roberts  Procedure(s) Performed: Procedure(s): APPENDECTOMY LAPAROSCOPIC (N/A)  Patient Location: PACU  Anesthesia Type:General  Level of Consciousness: awake, alert , oriented and patient cooperative  Airway & Oxygen Therapy: Patient Spontanous Breathing and Patient connected to face mask oxygen  Post-op Assessment: Report given to RN, Post -op Vital signs reviewed and stable and Patient moving all extremities  Post vital signs: Reviewed and stable  Last Vitals:  Vitals:   01/21/17 0518 01/21/17 0758  BP: (!) 95/58 100/60  Pulse: 66 71  Resp: 16 16  Temp: 36.8 C 36.6 C    Last Pain:  Vitals:   01/21/17 0758  TempSrc: Oral  PainSc:       Patients Stated Pain Goal: 2 (92/76/39 4320)  Complications: No apparent anesthesia complications

## 2017-01-21 NOTE — Anesthesia Procedure Notes (Signed)
Procedure Name: Intubation Date/Time: 01/21/2017 9:31 AM Performed by: Carleene Cooper A Pre-anesthesia Checklist: Patient identified, Timeout performed, Emergency Drugs available, Suction available and Patient being monitored Patient Re-evaluated:Patient Re-evaluated prior to inductionOxygen Delivery Method: Circle system utilized Preoxygenation: Pre-oxygenation with 100% oxygen Intubation Type: IV induction Ventilation: Mask ventilation without difficulty Laryngoscope Size: Mac and 4 Grade View: Grade I Tube type: Oral Tube size: 7.5 mm Number of attempts: 1 Airway Equipment and Method: Stylet Placement Confirmation: ETT inserted through vocal cords under direct vision,  positive ETCO2 and breath sounds checked- equal and bilateral Secured at: 21 cm Tube secured with: Tape Dental Injury: Teeth and Oropharynx as per pre-operative assessment

## 2017-01-21 NOTE — ED Notes (Signed)
Pt's husband will be driving pt to [facility] ED and was given directions. Pt understands not to have anything to eat or drink. Pt understands not to tamper with IV left in place. IV secured for transport.

## 2017-01-21 NOTE — Anesthesia Postprocedure Evaluation (Signed)
Anesthesia Post Note  Patient: CABELLA KIMM  Procedure(s) Performed: Procedure(s) (LRB): APPENDECTOMY LAPAROSCOPIC (N/A)  Patient location during evaluation: PACU Anesthesia Type: General Level of consciousness: awake and alert Pain management: pain level controlled Vital Signs Assessment: post-procedure vital signs reviewed and stable Respiratory status: spontaneous breathing, nonlabored ventilation, respiratory function stable and patient connected to nasal cannula oxygen Cardiovascular status: blood pressure returned to baseline and stable Postop Assessment: no signs of nausea or vomiting Anesthetic complications: no       Last Vitals:  Vitals:   01/21/17 0758 01/21/17 1033  BP: 100/60 103/70  Pulse: 71 69  Resp: 16 12  Temp: 36.6 C 37.1 C    Last Pain:  Vitals:   01/21/17 0758  TempSrc: Oral  PainSc:                  Catalina Gravel

## 2017-01-21 NOTE — Anesthesia Preprocedure Evaluation (Addendum)
Anesthesia Evaluation  Patient identified by MRN, date of birth, ID band Patient awake    Reviewed: Allergy & Precautions, NPO status , Patient's Chart, lab work & pertinent test results  Airway Mallampati: II  TM Distance: >3 FB Neck ROM: Full    Dental  (+) Teeth Intact, Dental Advisory Given   Pulmonary former smoker,    Pulmonary exam normal breath sounds clear to auscultation       Cardiovascular Exercise Tolerance: Good negative cardio ROS Normal cardiovascular exam Rhythm:Regular Rate:Normal     Neuro/Psych PSYCHIATRIC DISORDERS Depression negative neurological ROS     GI/Hepatic Neg liver ROS, GERD  Controlled and Medicated,  Endo/Other  negative endocrine ROS  Renal/GU negative Renal ROS     Musculoskeletal negative musculoskeletal ROS (+)   Abdominal   Peds  Hematology negative hematology ROS (+)   Anesthesia Other Findings Day of surgery medications reviewed with the patient.  Reproductive/Obstetrics                             Anesthesia Physical Anesthesia Plan  ASA: II  Anesthesia Plan: General   Post-op Pain Management:    Induction: Intravenous  Airway Management Planned: Oral ETT  Additional Equipment:   Intra-op Plan:   Post-operative Plan: Extubation in OR  Informed Consent: I have reviewed the patients History and Physical, chart, labs and discussed the procedure including the risks, benefits and alternatives for the proposed anesthesia with the patient or authorized representative who has indicated his/her understanding and acceptance.   Dental advisory given  Plan Discussed with: CRNA  Anesthesia Plan Comments: (Risks/benefits of general anesthesia discussed with patient including risk of damage to teeth, lips, gum, and tongue, nausea/vomiting, allergic reactions to medications, and the possibility of heart attack, stroke and death.  All patient  questions answered.  Patient wishes to proceed.)        Anesthesia Quick Evaluation

## 2017-01-21 NOTE — Progress Notes (Signed)
Assessment unchanged. Tolerating po, voiding adequately and having little to no pain. Pt verbalized understanding of dc instructions through teach back including when to call MD and follow up care. Scripts given as provided by MD. Discharged via foot per request to front entrance to meet mother in law and awaiting vehicle to carry home. Accompanied by NT.

## 2017-01-21 NOTE — Progress Notes (Signed)
Received call from lab. Pt MRSA PCR swab is being sent to [facility] due to it being invalid.

## 2017-01-21 NOTE — Discharge Instructions (Signed)
LAPAROSCOPIC SURGERY: POST OP INSTRUCTIONS  1. DIET: Follow a light bland diet the first 24 hours after arrival home, such as soup, liquids, crackers, etc. Be sure to include lots of fluids daily. Avoid fast food or heavy meals as your are more likely to get nauseated. Eat a low fat the next few days after surgery.  2. Take your usually prescribed home medications unless otherwise directed. 3. PAIN CONTROL:  1. Pain is best controlled by a usual combination of three different methods TOGETHER:  1. Ice/Heat 2. Over the counter pain medication 3. Prescription pain medication 2. Most patients will experience some swelling and bruising around the incisions. Ice packs or heating pads (30-60 minutes up to 6 times a day) will help. Use ice for the first few days to help decrease swelling and bruising, then switch to heat to help relax tight/sore spots and speed recovery. Some people prefer to use ice alone, heat alone, alternating between ice & heat. Experiment to what works for you. Swelling and bruising can take several weeks to resolve.  3. It is helpful to take an over-the-counter pain medication regularly for the first few weeks. Choose one of the following that works best for you:  1. Naproxen (Aleve, etc) Two 270m tabs twice a day 2. Ibuprofen (Advil, etc) Three 2043mtabs four times a day (every meal & bedtime) 3. Acetaminophen (Tylenol, etc) 500-65032mour times a day (every meal & bedtime) 4. A prescription for pain medication (such as oxycodone, hydrocodone, etc) should be given to you upon discharge. Take your pain medication as prescribed.  1. If you are having problems/concerns with the prescription medicine (does not control pain, nausea, vomiting, rash, itching, etc), please call us [phone] see if we need to switch you to a different pain medicine that will work better for you and/or control your side effect better. 2. If you need a refill on your pain medication, please contact  your pharmacy. They will contact our office to request authorization. Prescriptions will not be filled after 5 pm or on week-ends. 4. Avoid getting constipated. Between the surgery and the pain medications, it is common to experience some constipation. Increasing fluid intake and taking a fiber supplement (such as Metamucil, Citrucel, FiberCon, MiraLax, etc) 1-2 times a day regularly will usually help prevent this problem from occurring. A mild laxative (prune juice, Milk of Magnesia, MiraLax, etc) should be taken according to package directions if there are no bowel movements after 48 hours.  5. Watch out for diarrhea. If you have many loose bowel movements, simplify your diet to bland foods & liquids for a few days. Stop any stool softeners and decrease your fiber supplement. Switching to mild anti-diarrheal medications (Kayopectate, Pepto Bismol) can help. If this worsens or does not improve, please call us.[place]. Wash / shower every day. You may shower over the dressings as they are waterproof. Continue to shower over incision(s) after the dressing is off. 7. Remove your waterproof bandages 5 days after surgery. You may leave the incision open to air. You may replace a dressing/Band-Aid to cover the incision for comfort if you wish.  8. ACTIVITIES as tolerated:  1. You may resume regular (light) daily activities beginning the next day--such as daily self-care, walking, climbing stairs--gradually increasing activities as tolerated. If you can walk 30 minutes without difficulty, it is safe to try more intense activity such as jogging, treadmill, bicycling, low-impact aerobics, swimming, etc. 2. Save the most intensive and strenuous activity for  last such as sit-ups, heavy lifting, contact sports, etc Refrain from any heavy lifting or straining until you are off narcotics for pain control.  3. DO NOT PUSH THROUGH PAIN. Let pain be your guide: If it hurts to do something, don't do it. Pain is your body warning  you to avoid that activity for another week until the pain goes down. 4. You may drive when you are no longer taking prescription pain medication, you can comfortably wear a seatbelt, and you can safely maneuver your car and apply brakes. 5. You may have sexual intercourse when it is comfortable.  9. FOLLOW UP in our office  1. Please call [facility] at [phone] to set up an appointment to see your surgeon in the office for a follow-up appointment approximately 2-3 weeks after your surgery. 2. Make sure that you call for this appointment the day you arrive home to insure a convenient appointment time.      10. IF YOU HAVE DISABILITY OR FAMILY LEAVE FORMS, BRING THEM TO THE               OFFICE FOR PROCESSING.   WHEN TO CALL us [phone]:  1. Poor pain control 2. Reactions / problems with new medications (rash/itching, nausea, etc)  3. Fever over 101.5 F (38.5 C) 4. Inability to urinate 5. Nausea and/or vomiting 6. Worsening swelling or bruising 7. Continued bleeding from incision. 8. Increased pain, redness, or drainage from the incision  The clinic staff is available to answer your questions during regular business hours (8:30am-5pm). Please dont hesitate to call and ask to speak to one of our nurses for clinical concerns.  If you have a medical emergency, go to the nearest emergency room or call 911.  A surgeon from [facility] is always on call at the [facility], [place], [place], [place], [place] [address] ?  MAIN: [phone] ? TOLL FREE: [phone] ?  FAX [phone]  [email or web address]

## 2017-01-21 NOTE — Progress Notes (Signed)
Veronica Roberts is a 41 y.o. female patient admitted from ED awake, alert - oriented  X 4 - no acute distress noted.  VSS - Blood pressure 116/79, pulse 69, temperature 98.9 F (37.2 C), resp. rate 16, height 5' 9"  (1.753 m), weight 90.3 kg (199 lb), SpO2 100 %.    IV in place, occlusive dsg intact without redness.  Orientation to room, and floor completed with information packet given to patient/family.  Patient declined safety video at this time.  Admission INP armband ID verified with patient/family, and in place.   SR up x 2, fall assessment complete, with patient and family able to verbalize understanding of risk associated with falls, and verbalized understanding to call nsg before up out of bed.  Call light within reach, patient able to voice, and demonstrate understanding.  Skin, clean-dry- intact without evidence of bruising, or skin tears.   No evidence of skin break down noted on exam.     Will cont to eval and treat per MD orders.  Marcy Salvo, RN 01/21/2017 3:43 AM

## 2017-01-21 NOTE — Op Note (Signed)
Operative Report  Veronica Roberts 41 y.o. female  [id number]  [id number]  01/21/2017  Surgeon: Clovis Riley   Assistant: none  Procedure performed: Laparoscopic Appendectomy  Preop diagnosis: Acute appendicitis  Post-op diagnosis/intraop findings: Acute appendicitis  Specimens: appendix  EBL: minimal  Complications: none  Description of procedure: After obtaining informed consent the patient was brought to the operating room. Antibiotics and subcutaneous heparin were administered. SCD's were applied. General endotracheal anesthesia was initiated and a formal time-out was performed. The abdomen was prepped and draped in the usual sterile fashion and the abdomen was entered using an infraumbilical Veress needle and insufflated to 15 mmHg. A 5 mm trocar and camera were then introduced, the abdomen was inspected and there is no evidence of injury from our entry. A suprapubic 5 mm trocar and a left lower quadrant 12 mm trocar were introduced under direct visualization following infiltration with local. The patient was then placed in Trendelenburg and rotated to the left and the small bowel and omentum was reflected cephalad. The appendix was visualized: it was dilated, edematous and injected, a small amount of serous fluid but no purulence was present. The appendix was retracted and a blunt dissection was used to create a window in the mesoappendix at its base. Great care was taken to ensure no injury to surrounding retroperitoneal structures, cecum or terminal ileum. A blue load linear cutting stapler was used to transect the appendix from the cecum. The harmonic scalpel was then used to transect the appendiceal mesentery. Hemostasis was ensured. The appendix was placed in an Endo Catch bag and removed through our 12 mm trocar. The abdomen was then reinspected. The staple line was viable, intact and hemostatic. The serous fluid in the pelvis was aspirated. A simple right ovarian cyst  about 4cm in diameter was noted. The ileal sail and omentum were brought down to lie over the staple line. The 73m trocar site in the left lower quadrant was closed with a 0 vicryl in the fascia under direct visualization using a transfascial suture passer. The abdomen was desufflated and all trocars removed. The skin incisions were closed with running subcuticular monocryl and liquiban. The patient was awakened, extubated and transported to the recovery room in stable condition.   All counts were correct at the completion of the case.

## 2017-01-21 NOTE — Interval H&P Note (Signed)
History and Physical Interval Note:  01/21/2017 9:11 AM  Veronica Roberts  has presented today for surgery, with the diagnosis of Appendicitis  The various methods of treatment have been discussed with the patient and family. After consideration of risks, benefits and other options for treatment, the patient has consented to  Procedure(s): APPENDECTOMY LAPAROSCOPIC (N/A) as a surgical intervention .  The patient's history has been reviewed, patient examined, no change in status other than her pain has improved, stable for surgery.  I have reviewed the patient's chart and labs.  Questions were answered to the patient's satisfaction.     Jevaughn Degollado Rich Brave

## 2017-01-22 ENCOUNTER — Encounter (HOSPITAL_COMMUNITY): Payer: Self-pay | Admitting: Surgery

## 2017-01-22 NOTE — Discharge Summary (Signed)
[facility] Discharge Summary   Patient ID: Veronica Roberts MRN: [id number] DOB/AGE: August 02, 1976 41 y.o.  Admit date: 01/20/2017 Discharge date: 01/21/2017  Admitting Diagnosis: Acute appendicitis  Discharge Diagnosis Patient Active Problem List   Diagnosis Date Noted  . Appendicitis 01/21/2017  . Acute appendicitis 01/20/2017  . GERD (gastroesophageal reflux disease)   . Depression     Consultants None  Imaging: Ct Abdomen Pelvis W Contrast  Result Date: 01/20/2017 CLINICAL DATA:  Abdominal pain for 3 days, nausea. EXAM: CT ABDOMEN AND PELVIS WITH CONTRAST TECHNIQUE: Multidetector CT imaging of the abdomen and pelvis was performed using the standard protocol following bolus administration of intravenous contrast. CONTRAST:  183m ISOVUE-300 IOPAMIDOL (ISOVUE-300) INJECTION 61% COMPARISON:  None. FINDINGS: LOWER CHEST: Lung bases are clear. Included heart size is normal. No pericardial effusion. HEPATOBILIARY: A few scattered subcentimeter hypodensities in the liver most compatible with cysts, otherwise unremarkable. Gallbladder is normal. PANCREAS: Normal. SPLEEN: Normal. ADRENALS/URINARY TRACT: Kidneys are orthotopic, demonstrating symmetric enhancement. No nephrolithiasis, hydronephrosis or solid renal masses. The unopacified ureters are normal in course and caliber. Delayed imaging through the kidneys demonstrates symmetric prompt contrast excretion within the proximal urinary collecting system. Urinary bladder is partially distended and unremarkable. Normal adrenal glands. STOMACH/BOWEL: Small hiatal hernia. The stomach, small and large bowel are normal in course and caliber without inflammatory changes. The appendix is 16 mm in transaxial dimension, hyperemic with periappendiceal inflammation. Central appendiceal debris without appendicolith. VASCULAR/LYMPHATIC: Aortoiliac vessels are normal in course and caliber, trace calcific atherosclerosis. Borderline lymph nodes RIGHT  lower quadrant are likely reactive. REPRODUCTIVE: IUD in central uterus. OTHER: Trace free fluid in RIGHT lower quadrant. Small amount of free fluid in the pelvis which may be physiologic or reactive to appendiceal process. MUSCULOSKELETAL: Nonacute. Small fat containing umbilical hernia. Scattered Schmorl's nodes. IMPRESSION: Acute non perforated appendicitis. Acute findings discussed with and reconfirmed by Dr.NATHAN PICKERING on 01/20/2017 at 10:06 pm. Electronically Signed   By: CElon AlasM.D.   On: 01/20/2017 22:07    Procedures Dr. CKae Heller(01/21/17) - Laparoscopic Appendectomy  Hospital Course:  Veronica BROPHYis a 89yofemale [facility] that gradually worsened and localized to the RLQ.  Workup showed acute appendicitis.  Patient was admitted and underwent procedure listed above.  Tolerated procedure well and was transferred to the floor.  Diet was advanced as tolerated.  On POD0 the patient was voiding well, tolerating diet, ambulating well, pain well controlled, vital signs stable, incisions c/d/i and felt stable for discharge home.  Patient will follow up in our office in 2-3 weeks and knows to call with questions or concerns.    Physical Exam: General:  Alert, NAD, pleasant, comfortable Pulm: effort normal Abd:  Soft, ND, appropriately tender, incisions C/D/I, +BS  Allergies as of 01/21/2017   No Known Allergies     Medication List    TAKE these medications   escitalopram 5 MG tablet Commonly known as:  LEXAPRO Take 1 tablet by mouth daily.   multivitamin with minerals Tabs tablet Take 1 tablet by mouth daily.   ondansetron 4 MG disintegrating tablet Commonly known as:  ZOFRAN-ODT Take 1 tablet (4 mg total) by mouth every 6 (six) hours as needed for nausea.   oxyCODONE 5 MG immediate release tablet Commonly known as:  Oxy IR/ROXICODONE Take 1-2 tablets (5-10 mg total) by mouth every 4 (four) hours as  needed for moderate pain, severe pain  or breakthrough pain.   PRILOSEC PO Take by mouth.        Follow-up [facility], [place]. Go on 02/02/2017.   Specialty:  General Surgery Why:  Your appointment is 02/02/17 at 9:30AM, please arrive at least 30 min before your appointment to complete your check in paperwork.  If you are unable to arrive 30 min prior to your appointment time we may have to cancel or reschedule you. Contact information: [address] [place] [phone]          Signed: Jerrye Beavers, [facility] 01/22/2017, 1:37 PM Pager: [phone] Consults: [phone] Mon-Fri 7:00 am-4:30 pm Sat-Sun 7:00 am-11:30 am

## 2017-01-23 LAB — MRSA CULTURE: CULTURE: NOT DETECTED

## 2020-07-11 ENCOUNTER — Other Ambulatory Visit: Payer: Self-pay | Admitting: Obstetrics and Gynecology

## 2020-07-11 DIAGNOSIS — N6310 Unspecified lump in the right breast, unspecified quadrant: Secondary | ICD-10-CM

## 2020-07-26 ENCOUNTER — Other Ambulatory Visit: Payer: BLUE CROSS/BLUE SHIELD

## 2020-07-30 ENCOUNTER — Ambulatory Visit
Admission: RE | Admit: 2020-07-30 | Discharge: 2020-07-30 | Disposition: A | Discharge status: Home / Self Care | Payer: BC Managed Care – PPO | Source: Ambulatory Visit | Attending: Obstetrics and Gynecology | Admitting: Obstetrics and Gynecology

## 2020-07-30 ENCOUNTER — Other Ambulatory Visit: Payer: Self-pay

## 2020-07-30 ENCOUNTER — Ambulatory Visit
Admission: RE | Admit: 2020-07-30 | Discharge: 2020-07-30 | Disposition: A | Discharge status: Home / Self Care | Payer: BLUE CROSS/BLUE SHIELD | Source: Ambulatory Visit | Attending: Obstetrics and Gynecology | Admitting: Obstetrics and Gynecology

## 2020-07-30 DIAGNOSIS — N6310 Unspecified lump in the right breast, unspecified quadrant: Secondary | ICD-10-CM

## 2022-01-17 IMAGING — MG MM DIGITAL DIAGNOSTIC UNILAT*R* W/ TOMO W/ CAD
4 series · 4 of 12 positions shown · non-contrast
Comparison: Previous exam(s).

CLINICAL DATA: 44-year-old female presenting as a recall from
screening for possible right breast mass.

EXAM:
DIGITAL DIAGNOSTIC RIGHT MAMMOGRAM WITH TOMO
ULTRASOUND RIGHT BREAST

[R CC synth-2D]
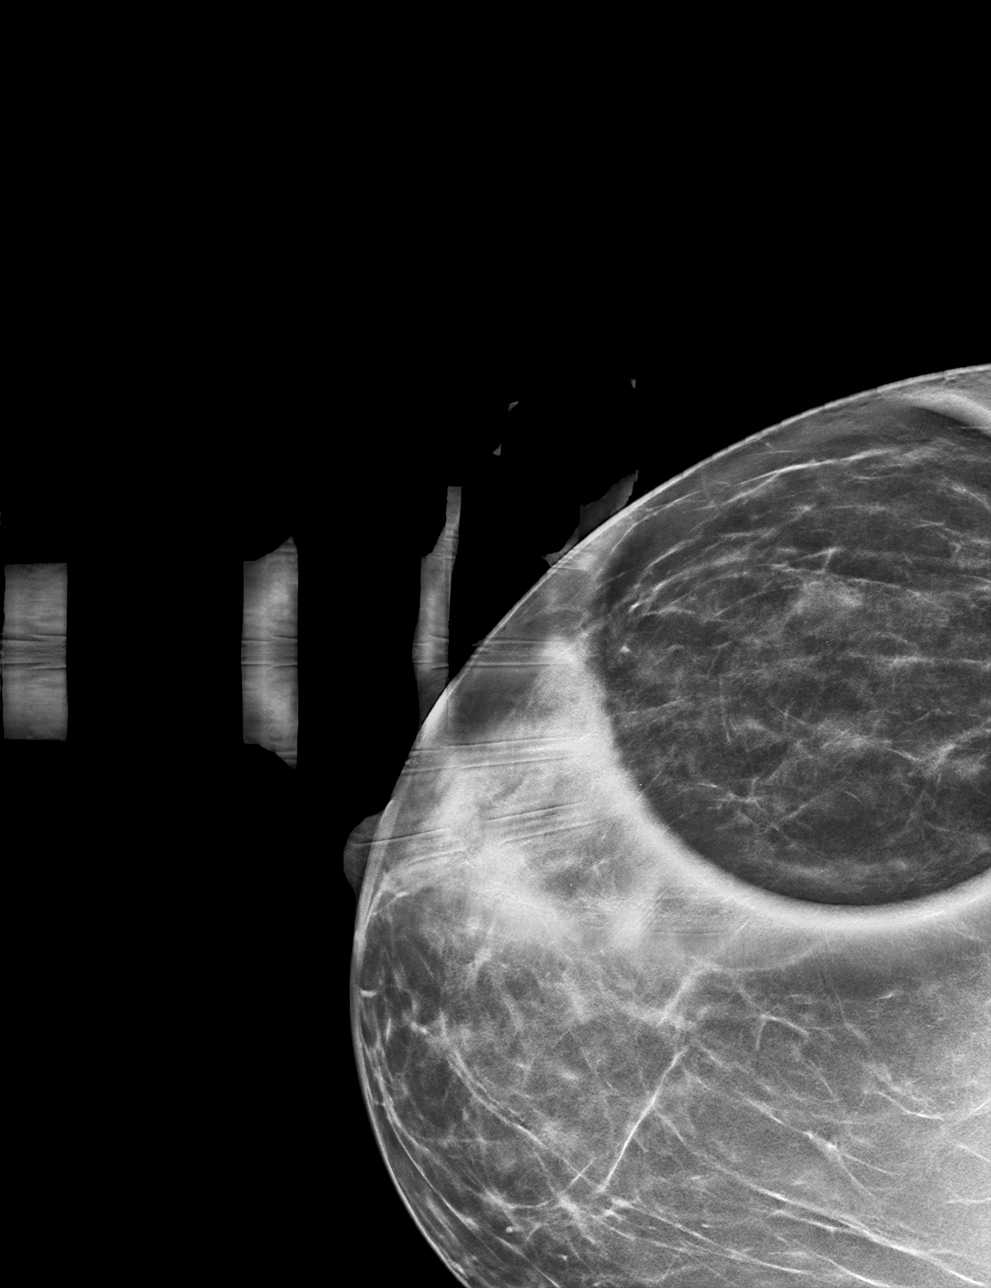

[R MLO synth-2D]
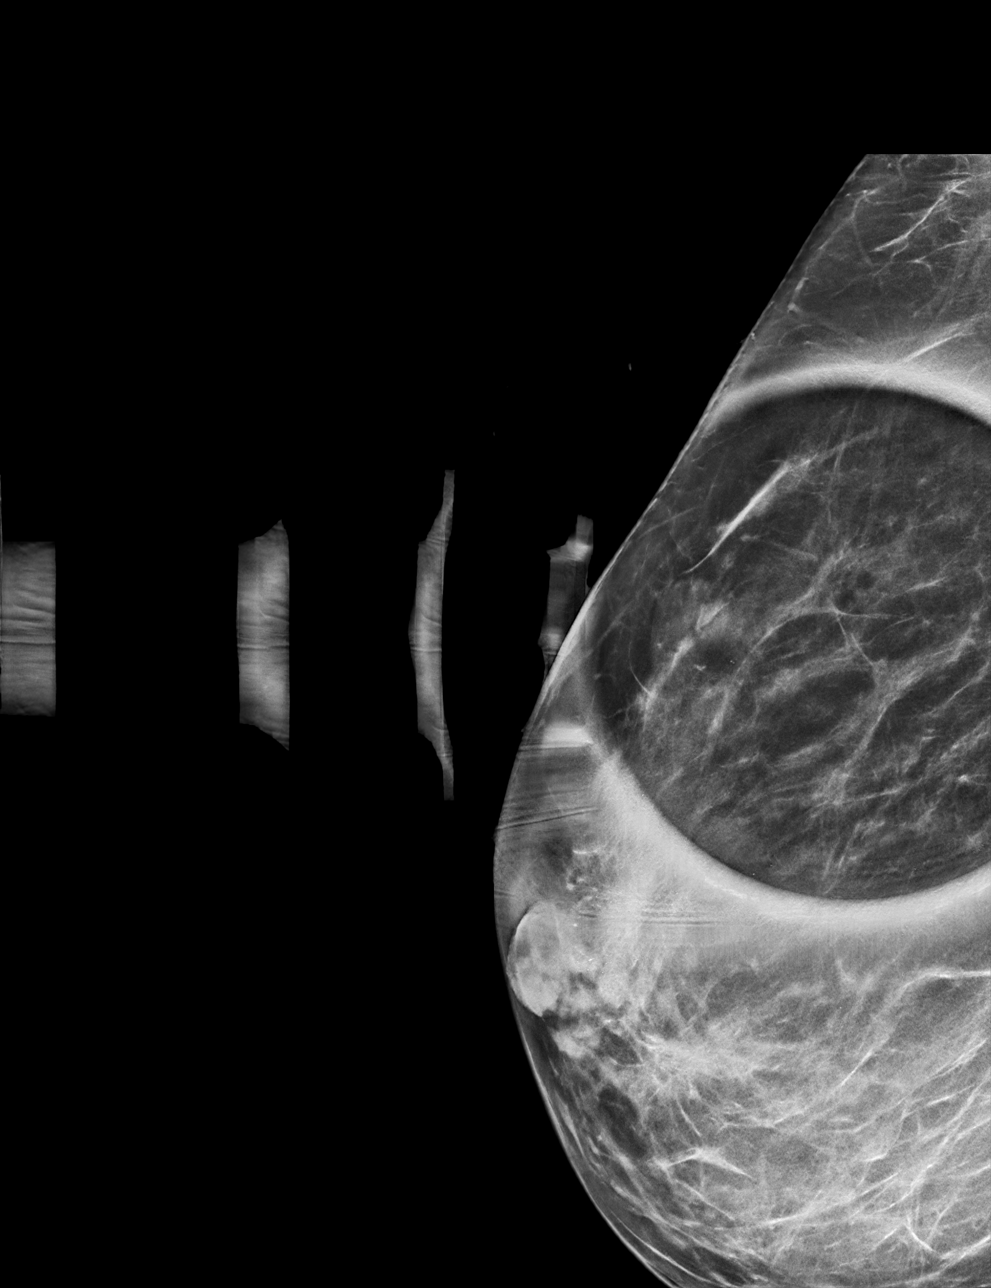

[R MLO tomo · tomo slice 34/67.0]
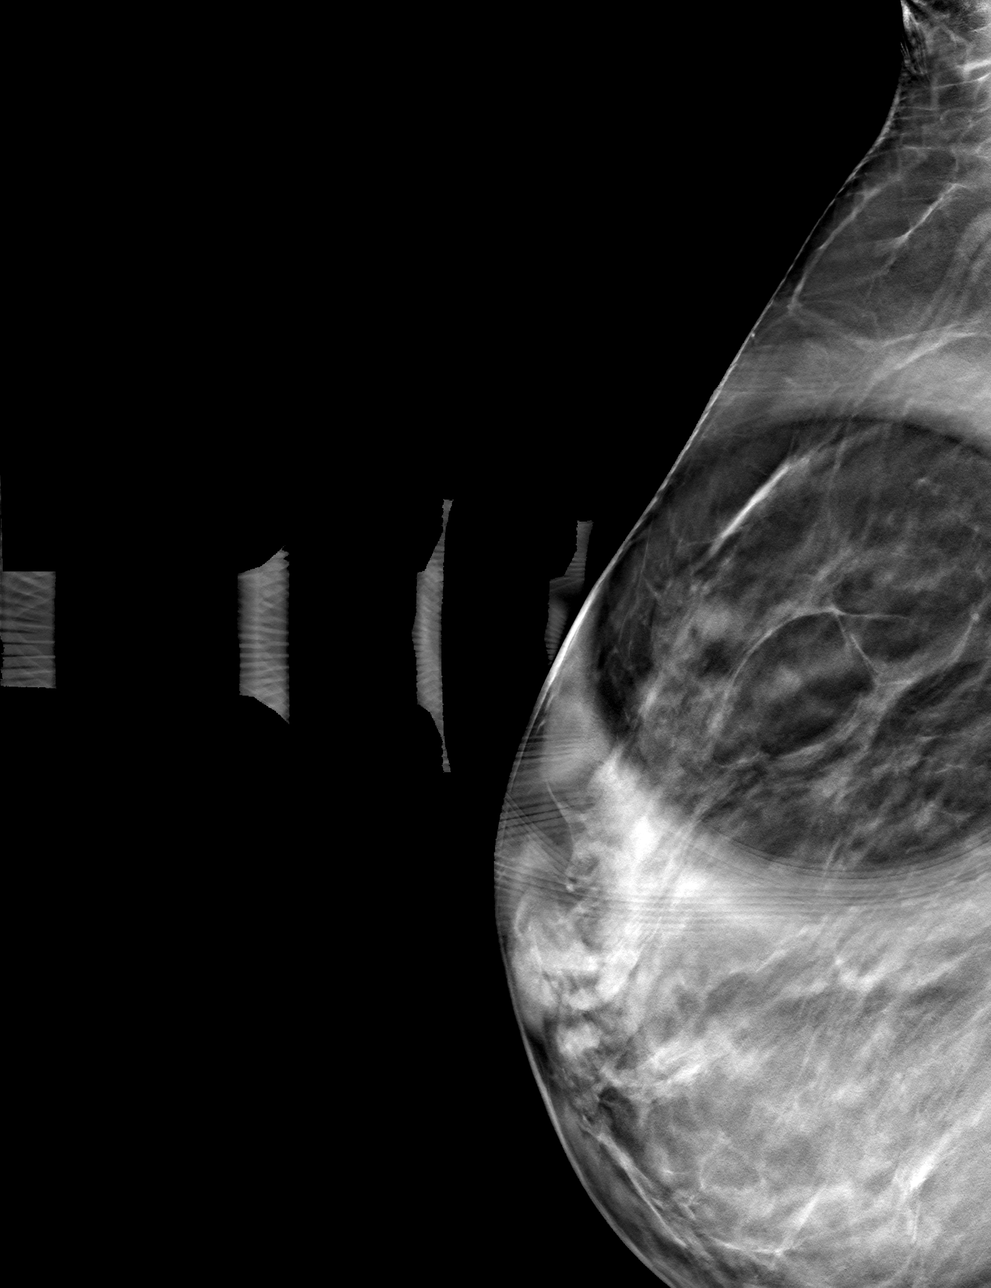

[R CC tomo · tomo slice 31/62.0]
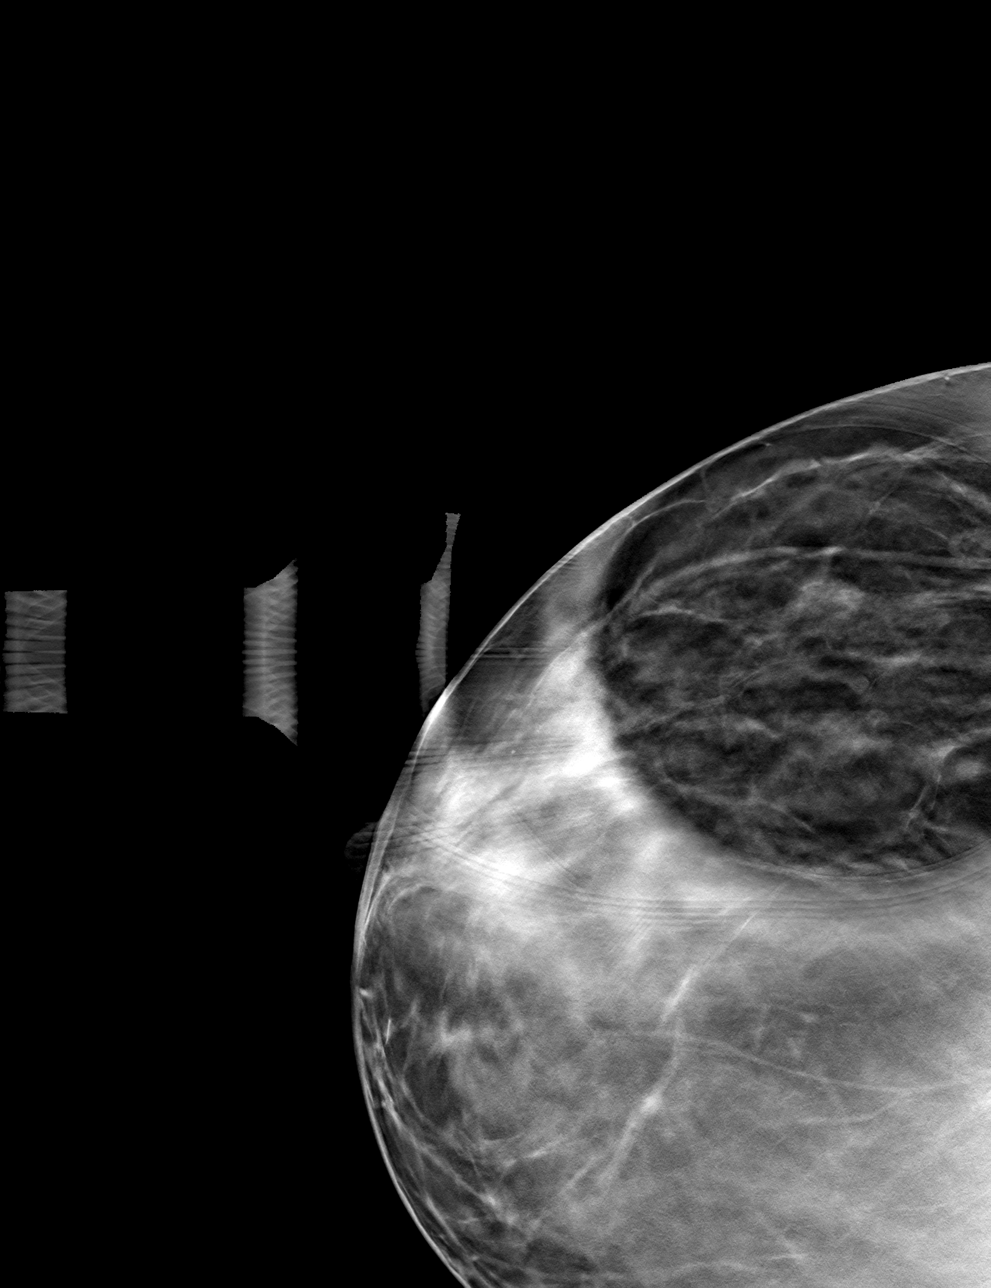

[4 of 12 positions shown; findings below may reference images not displayed]

ACR Breast Density Category c: The breast tissue is heterogeneously
dense, which may obscure small masses.
FINDINGS: Mammogram:

Spot compression tomosynthesis views of the right breast were
performed. There is persistence of an oval circumscribed mass in the
upper outer quadrant of the right breast measuring approximately
cm. Several additional smaller oval and round circumscribed masses
are identified in the vicinity.

Ultrasound:

Targeted ultrasound is performed throughout the upper-outer quadrant
of the right breast demonstrating fibrocystic changes including
clusters of cysts and simple cysts. The largest cluster measures
x 0.6 x 1.0 cm and likely corresponds to the mass identified on
mammogram. There is no suspicious solid mass.
IMPRESSION: In the upper-outer quadrant of the right breast there are multiple
benign cysts and clusters of cysts. No mammographic or sonographic
evidence of malignancy.

RECOMMENDATION:
Screening mammogram in one year.(Code:4P-9-KH9)

I have discussed the findings and recommendations with the patient.
If applicable, a reminder letter will be sent to the patient
regarding the next appointment.

BI-RADS CATEGORY  2: Benign.

## 2023-01-21 ENCOUNTER — Other Ambulatory Visit: Payer: Self-pay | Admitting: Obstetrics and Gynecology

## 2023-01-21 DIAGNOSIS — N63 Unspecified lump in unspecified breast: Secondary | ICD-10-CM

## 2023-01-29 ENCOUNTER — Ambulatory Visit
Admission: RE | Admit: 2023-01-29 | Discharge: 2023-01-29 | Disposition: A | Discharge status: Home / Self Care | Payer: BC Managed Care – PPO | Source: Ambulatory Visit | Attending: Obstetrics and Gynecology | Admitting: Obstetrics and Gynecology

## 2023-01-29 DIAGNOSIS — N63 Unspecified lump in unspecified breast: Secondary | ICD-10-CM
